# Patient Record
Sex: Female | Born: 1980 | Hispanic: Yes | Marital: Single | Smoking: Never smoker
Health system: Southern US, Community
[De-identification: ages and names within clinical notes are randomized; demographics above are authoritative.]

---

## 2019-06-07 ENCOUNTER — Emergency Department (HOSPITAL_BASED_OUTPATIENT_CLINIC_OR_DEPARTMENT_OTHER): Payer: Self-pay

## 2019-06-07 ENCOUNTER — Other Ambulatory Visit: Payer: Self-pay

## 2019-06-07 ENCOUNTER — Emergency Department (HOSPITAL_BASED_OUTPATIENT_CLINIC_OR_DEPARTMENT_OTHER)
Admission: EM | Admit: 2019-06-07 | Discharge: 2019-06-07 | Disposition: A | Discharge status: Home / Self Care | Payer: Self-pay | Attending: Emergency Medicine | Admitting: Emergency Medicine

## 2019-06-07 ENCOUNTER — Encounter (HOSPITAL_BASED_OUTPATIENT_CLINIC_OR_DEPARTMENT_OTHER): Payer: Self-pay | Admitting: Emergency Medicine

## 2019-06-07 DIAGNOSIS — R51 Headache: Secondary | ICD-10-CM | POA: Insufficient documentation

## 2019-06-07 DIAGNOSIS — R05 Cough: Secondary | ICD-10-CM | POA: Insufficient documentation

## 2019-06-07 DIAGNOSIS — R42 Dizziness and giddiness: Secondary | ICD-10-CM | POA: Insufficient documentation

## 2019-06-07 DIAGNOSIS — U071 COVID-19: Secondary | ICD-10-CM | POA: Insufficient documentation

## 2019-06-07 DIAGNOSIS — R079 Chest pain, unspecified: Secondary | ICD-10-CM | POA: Insufficient documentation

## 2019-06-07 DIAGNOSIS — R202 Paresthesia of skin: Secondary | ICD-10-CM | POA: Insufficient documentation

## 2019-06-07 LAB — URINALYSIS, MICROSCOPIC (REFLEX)

## 2019-06-07 LAB — COMPREHENSIVE METABOLIC PANEL
ALT: 29 U/L (ref 0–44)
AST: 27 U/L (ref 15–41)
Albumin: 3.8 g/dL (ref 3.5–5.0)
Alkaline Phosphatase: 68 U/L (ref 38–126)
Anion gap: 11 (ref 5–15)
BUN: 7 mg/dL (ref 6–20)
CO2: 21 mmol/L — ABNORMAL LOW (ref 22–32)
Calcium: 8.7 mg/dL — ABNORMAL LOW (ref 8.9–10.3)
Chloride: 106 mmol/L (ref 98–111)
Creatinine, Ser: 0.74 mg/dL (ref 0.44–1.00)
GFR calc Af Amer: >60 mL/min (ref >60)
GFR calc non Af Amer: >60 mL/min (ref >60)
Glucose, Bld: 108 mg/dL — ABNORMAL HIGH (ref 70–99)
Potassium: 3.6 mmol/L (ref 3.5–5.1)
Sodium: 138 mmol/L (ref 135–145)
Total Bilirubin: 0.3 mg/dL (ref 0.3–1.2)
Total Protein: 7.6 g/dL (ref 6.5–8.1)

## 2019-06-07 LAB — CBC
HCT: 40.2 % (ref 36.0–46.0)
Hemoglobin: 13.2 g/dL (ref 12.0–15.0)
MCH: 28.6 pg (ref 26.0–34.0)
MCHC: 32.8 g/dL (ref 30.0–36.0)
MCV: 87.2 fL (ref 80.0–100.0)
Platelets: 246 10*3/uL (ref 150–400)
RBC: 4.61 MIL/uL (ref 3.87–5.11)
RDW: 13 % (ref 11.5–15.5)
WBC: 3.4 10*3/uL — ABNORMAL LOW (ref 4.0–10.5)
nRBC: 0 % (ref 0.0–0.2)

## 2019-06-07 LAB — URINALYSIS, ROUTINE W REFLEX MICROSCOPIC
Bilirubin Urine: NEGATIVE
Glucose, UA: NEGATIVE mg/dL
Ketones, ur: NEGATIVE mg/dL
Nitrite: NEGATIVE
Protein, ur: NEGATIVE mg/dL
Specific Gravity, Urine: 1.01 (ref 1.005–1.030)
pH: 7.5 (ref 5.0–8.0)

## 2019-06-07 LAB — TROPONIN I (HIGH SENSITIVITY): Troponin I (High Sensitivity): 2 ng/L (ref <18)

## 2019-06-07 MED ORDER — SODIUM CHLORIDE 0.9 % IV BOLUS
1000.0000 mL | Freq: Once | INTRAVENOUS | Status: AC
  Administered 2019-06-07: 13:00:00 1000 mL via INTRAVENOUS

## 2019-06-07 NOTE — ED Notes (Signed)
RN Ezzard Flax was informed of Pt stating she tested positive for covid. Told RN Ezzard Flax that I got Pts vitals and had setup the monitor for an EKG, however, I stepped out of room due to not having right PPE because I was just informed of her positive test of covid.

## 2019-06-07 NOTE — ED Triage Notes (Signed)
Pt had chest pain this am. Pt is positive for Covid.  Pt states she has been sick since last Thursday.  Was starting to get better but has been worried about her 40 yo daughter who has recently been feverish.  Pt states she did not sleep well at all, maybe 1.5 hours and woke up suddenly in a panic.  Pt had chest pain, sob, facial and hand numbness.  All symptoms has subsided but wanted to be checked out.  Fevers at home have been normal.  No sob prior to this am.  Pt has generalized aches an pains otherwise.

## 2019-06-07 NOTE — ED Notes (Signed)
X-ray at bedside

## 2019-06-07 NOTE — Discharge Instructions (Addendum)
You were evaluated in the Emergency Department and after careful evaluation, we did not find any emergent condition requiring admission or further testing in the hospital.  Your symptoms today seem to be due to continued symptoms related to the coronavirus.  Your testing here is overall very reassuring.  You are safe to return home and drink plenty of fluids.  Please return to the Emergency Department if you experience any worsening of your condition.  We encourage you to follow up with a primary care provider.  Thank you for allowing [place] to be a part of your care.

## 2019-06-07 NOTE — ED Provider Notes (Signed)
[facility] Emergency Department Provider Note MRN:  [id number]  Arrival date & time: 06/07/19     Chief Complaint   Chest Pain   History of Present Illness   Kayla Serrano is a 38 y.o. year-old female with no pertinent past medical history presenting to the ED with chief complaint of chest pain.  Patient explains that she was recently diagnosed with the coronavirus.  She has been experiencing general malaise, mild cough, mild headache, decreased energy.  She did not sleep well last night because her daughter spiked a fever.  She became very anxious about her daughter's condition this morning, and shortly after began experiencing sharp chest pain accompanied by paresthesias to bilateral hands.  Symptoms resolved after a few minutes.  She denies shortness of breath, no leg pain or swelling, no numbness or weakness to the arms or legs.  Review of Systems  A complete 10 system review of systems was obtained and all systems are negative except as noted in the HPI and PMH.   Patient's Health History   Past medical history: Level coronavirus   No family history on file.  Social History   Socioeconomic History  . Marital status: Single    Spouse name: Not on file  . Number of children: Not on file  . Years of education: Not on file  . Highest education level: Not on file  Occupational History  . Not on file  Social Needs  . Financial resource strain: Not on file  . Food insecurity    Worry: Not on file    Inability: Not on file  . Transportation needs    Medical: Not on file    Non-medical: Not on file  Tobacco Use  . Smoking status: Never Smoker  . Smokeless tobacco: Never Used  Substance and Sexual Activity  . Alcohol use: Not on file  . Drug use: Not on file  . Sexual activity: Not on file  Lifestyle  . Physical activity    Days per week: Not on file    Minutes per session: Not on file  . Stress: Not on file  Relationships  .  Social Herbalist on phone: Not on file    Gets together: Not on file    Attends religious service: Not on file    Active member of club or organization: Not on file    Attends meetings of clubs or organizations: Not on file    Relationship status: Not on file  . Intimate partner violence    Fear of current or ex partner: Not on file    Emotionally abused: Not on file    Physically abused: Not on file    Forced sexual activity: Not on file  Other Topics Concern  . Not on file  Social History Narrative  . Not on file     Physical Exam  Vital Signs and Nursing Notes reviewed Vitals:   06/07/19 1222  BP: (!) 165/121  Pulse: 75  Resp: 16  Temp: 98.5 F (36.9 C)  SpO2: 100%    CONSTITUTIONAL: Well-appearing, NAD NEURO:  Alert and oriented x 3, normal and symmetric strength and sensation, normal coordination, normal speech EYES:  eyes equal and reactive ENT/NECK:  no LAD, no JVD CARDIO: Regular rate, well-perfused, normal S1 and S2 PULM:  CTAB no wheezing or rhonchi, no increased work of breathing GI/GU:  normal bowel sounds, non-distended, non-tender MSK/SPINE:  No gross deformities, no edema SKIN:  no rash, atraumatic PSYCH:  Appropriate speech and behavior  Diagnostic and Interventional Summary    EKG Interpretation  Date/Time:  Thursday June 07 2019 12:13:44 EDT Ventricular Rate:  72 PR Interval:    QRS Duration: 84 QT Interval:  405 QTC Calculation: 444 R Axis:   72 Text Interpretation:  Sinus rhythm Confirmed by Kennis CarinaBero, Johnmark Geiger [phone] on 06/07/2019 12:57:00 PM      Labs Reviewed  CBC - Abnormal; Notable for the following components:      Result Value   WBC 3.4 (*)    All other components within normal limits  COMPREHENSIVE METABOLIC PANEL - Abnormal; Notable for the following components:   CO2 21 (*)    Glucose, Bld 108 (*)    Calcium 8.7 (*)    All other components within normal limits  URINALYSIS, ROUTINE W REFLEX MICROSCOPIC - Abnormal;  Notable for the following components:   Hgb urine dipstick SMALL (*)    Leukocytes,Ua TRACE (*)    All other components within normal limits  URINALYSIS, MICROSCOPIC (REFLEX) - Abnormal; Notable for the following components:   Bacteria, UA MANY (*)    All other components within normal limits  TROPONIN I (HIGH SENSITIVITY)    DG Chest Port 1 View  Final Result      Medications  sodium chloride 0.9 % bolus 1,000 mL (1,000 mLs Intravenous New Bag/Given 06/07/19 1315)     Procedures Critical Care  ED Course and Medical Decision Making  I have reviewed the triage vital signs and the nursing notes.  Pertinent labs & imaging results that were available during my care of the patient were reviewed by me and considered in my medical decision making (see below for details).  Favoring dehydration as well as anxiety component causing chest discomfort and lightheadedness in this 38 year old female with a recent COVID-19 infection.  She has normal vital signs, she is with clear lungs, no increased work of breathing.  She has no evidence of DVT on exam, she is not tachycardic, she is PERC negative.  High-sensitivity troponin is less than 4, she has a low heart score, therefore there is a very low likelihood that this is cardiac etiology and she is appropriate for discharge.  After the discussed management above, the patient was determined to be safe for discharge.  The patient was in agreement with this plan and all questions regarding their care were answered.  ED return precautions were discussed and the patient will return to the ED with any significant worsening of condition.  Elmer SowMichael M. Pilar PlateBero, MD [facility] [email or web address]  Final Clinical Impressions(s) / ED Diagnoses     ICD-10-CM   1. Chest pain  R07.9 DG Chest [facility] Parort 1 View    DG Chest Port 1 View  2. Dizzy  R42   3. COVID-19 virus infection  U07.1     ED Discharge Orders    None          Sabas SousBero, Zayne Marovich M, MD 06/07/19 1420

## 2021-09-27 ENCOUNTER — Emergency Department (HOSPITAL_BASED_OUTPATIENT_CLINIC_OR_DEPARTMENT_OTHER): Payer: Self-pay

## 2021-09-27 ENCOUNTER — Other Ambulatory Visit: Payer: Self-pay

## 2021-09-27 ENCOUNTER — Encounter (HOSPITAL_BASED_OUTPATIENT_CLINIC_OR_DEPARTMENT_OTHER): Payer: Self-pay | Admitting: Emergency Medicine

## 2021-09-27 ENCOUNTER — Emergency Department (HOSPITAL_BASED_OUTPATIENT_CLINIC_OR_DEPARTMENT_OTHER)
Admission: EM | Admit: 2021-09-27 | Discharge: 2021-09-27 | Disposition: A | Discharge status: Home / Self Care | Payer: Self-pay | Attending: Emergency Medicine | Admitting: Emergency Medicine

## 2021-09-27 DIAGNOSIS — Z20822 Contact with and (suspected) exposure to covid-19: Secondary | ICD-10-CM | POA: Insufficient documentation

## 2021-09-27 DIAGNOSIS — N12 Tubulo-interstitial nephritis, not specified as acute or chronic: Secondary | ICD-10-CM | POA: Insufficient documentation

## 2021-09-27 DIAGNOSIS — J101 Influenza due to other identified influenza virus with other respiratory manifestations: Secondary | ICD-10-CM | POA: Insufficient documentation

## 2021-09-27 LAB — BASIC METABOLIC PANEL
Anion gap: 8 (ref 5–15)
BUN: 9 mg/dL (ref 6–20)
CO2: 25 mmol/L (ref 22–32)
Calcium: 8.6 mg/dL — ABNORMAL LOW (ref 8.9–10.3)
Chloride: 102 mmol/L (ref 98–111)
Creatinine, Ser: 0.99 mg/dL (ref 0.44–1.00)
GFR, Estimated: >60 mL/min (ref >60)
Glucose, Bld: 131 mg/dL — ABNORMAL HIGH (ref 70–99)
Potassium: 3.4 mmol/L — ABNORMAL LOW (ref 3.5–5.1)
Sodium: 135 mmol/L (ref 135–145)

## 2021-09-27 LAB — URINALYSIS, ROUTINE W REFLEX MICROSCOPIC
Bilirubin Urine: NEGATIVE
Glucose, UA: NEGATIVE mg/dL
Ketones, ur: NEGATIVE mg/dL
Nitrite: POSITIVE — AB
Protein, ur: 100 mg/dL — AB
Specific Gravity, Urine: 1.02 (ref 1.005–1.030)
pH: 6 (ref 5.0–8.0)

## 2021-09-27 LAB — CBC WITH DIFFERENTIAL/PLATELET
Abs Immature Granulocytes: 0.07 10*3/uL (ref 0.00–0.07)
Basophils Absolute: 0 10*3/uL (ref 0.0–0.1)
Basophils Relative: 0 %
Eosinophils Absolute: 0 10*3/uL (ref 0.0–0.5)
Eosinophils Relative: 0 %
HCT: 35.7 % — ABNORMAL LOW (ref 36.0–46.0)
Hemoglobin: 11.9 g/dL — ABNORMAL LOW (ref 12.0–15.0)
Immature Granulocytes: 1 %
Lymphocytes Relative: 8 %
Lymphs Abs: 0.8 10*3/uL (ref 0.7–4.0)
MCH: 28.3 pg (ref 26.0–34.0)
MCHC: 33.3 g/dL (ref 30.0–36.0)
MCV: 85 fL (ref 80.0–100.0)
Monocytes Absolute: 0.9 10*3/uL (ref 0.1–1.0)
Monocytes Relative: 8 %
Neutro Abs: 9 10*3/uL — ABNORMAL HIGH (ref 1.7–7.7)
Neutrophils Relative %: 83 %
Platelets: 197 10*3/uL (ref 150–400)
RBC: 4.2 MIL/uL (ref 3.87–5.11)
RDW: 14.5 % (ref 11.5–15.5)
WBC: 10.9 10*3/uL — ABNORMAL HIGH (ref 4.0–10.5)
nRBC: 0 % (ref 0.0–0.2)

## 2021-09-27 LAB — URINALYSIS, MICROSCOPIC (REFLEX)

## 2021-09-27 LAB — PREGNANCY, URINE: Preg Test, Ur: NEGATIVE

## 2021-09-27 LAB — RESP PANEL BY RT-PCR (FLU A&B, COVID) ARPGX2
Influenza A by PCR: POSITIVE — AB
Influenza B by PCR: NEGATIVE
SARS Coronavirus 2 by RT PCR: NEGATIVE

## 2021-09-27 MED ORDER — LACTATED RINGERS IV BOLUS
1000.0000 mL | Freq: Once | INTRAVENOUS | Status: AC
  Administered 2021-09-27: 1000 mL via INTRAVENOUS

## 2021-09-27 MED ORDER — ACETAMINOPHEN 325 MG PO TABS
650.0000 mg | ORAL_TABLET | Freq: Once | ORAL | Status: AC
  Administered 2021-09-27: 650 mg via ORAL
  Filled 2021-09-27: qty 2

## 2021-09-27 MED ORDER — PROMETHAZINE HCL 25 MG/ML IJ SOLN
INTRAMUSCULAR | Status: AC
  Filled 2021-09-27: qty 1

## 2021-09-27 MED ORDER — OSELTAMIVIR PHOSPHATE 75 MG PO CAPS: 75.0000 mg | ORAL_CAPSULE | Freq: Two times a day (BID) | ORAL | 0 refills | Status: AC

## 2021-09-27 MED ORDER — OSELTAMIVIR PHOSPHATE 75 MG PO CAPS
75.0000 mg | ORAL_CAPSULE | Freq: Once | ORAL | Status: AC
  Administered 2021-09-27: 75 mg via ORAL
  Filled 2021-09-27: qty 1

## 2021-09-27 MED ORDER — CIPROFLOXACIN HCL 500 MG PO TABS: 500.0000 mg | ORAL_TABLET | Freq: Two times a day (BID) | ORAL | 0 refills | Status: AC

## 2021-09-27 MED ORDER — PROMETHAZINE HCL 25 MG PO TABS: 25.0000 mg | ORAL_TABLET | Freq: Four times a day (QID) | ORAL | 0 refills | Status: AC | PRN

## 2021-09-27 MED ORDER — METOCLOPRAMIDE HCL 5 MG/ML IJ SOLN
10.0000 mg | Freq: Once | INTRAMUSCULAR | Status: AC
  Administered 2021-09-27: 10 mg via INTRAVENOUS
  Filled 2021-09-27: qty 2

## 2021-09-27 MED ORDER — FLUCONAZOLE 150 MG PO TABS: ORAL_TABLET | ORAL | 0 refills | Status: AC

## 2021-09-27 MED ORDER — SODIUM CHLORIDE 0.9 % IV SOLN
1.0000 g | Freq: Once | INTRAVENOUS | Status: AC
  Administered 2021-09-27: 1 g via INTRAVENOUS
  Filled 2021-09-27: qty 10

## 2021-09-27 MED ORDER — IOHEXOL 300 MG/ML  SOLN
100.0000 mL | Freq: Once | INTRAMUSCULAR | Status: AC | PRN
  Administered 2021-09-27: 100 mL via INTRAVENOUS

## 2021-09-27 MED ORDER — KETOROLAC TROMETHAMINE 15 MG/ML IJ SOLN
15.0000 mg | Freq: Once | INTRAMUSCULAR | Status: AC
  Administered 2021-09-27: 15 mg via INTRAVENOUS
  Filled 2021-09-27: qty 1

## 2021-09-27 MED ORDER — SODIUM CHLORIDE 0.9 % IV SOLN
12.5000 mg | Freq: Four times a day (QID) | INTRAVENOUS | Status: DC | PRN
  Administered 2021-09-27: 12.5 mg via INTRAVENOUS
  Filled 2021-09-27: qty 0.5

## 2021-09-27 NOTE — ED Provider Notes (Signed)
[facility] Provider Note: Kayla Dell, MD, FACEP  CSN: [id number] MRN: [id number] ARRIVAL: 09/27/21 at 0350 ROOM: MH02/MH02   CHIEF COMPLAINT  Fever   HISTORY OF PRESENT ILLNESS  09/27/21 4:03 AM Kayla Serrano is a 40 y.o. female with 3 days of headache, fever, sore throat and cough.  Yesterday she had some diarrhea and began vomiting.  She has had 2 days of right lower quadrant pain which is intermittent.  It radiates to the right flank.  She rates it as an 8 out of 10 and it is worse with palpation or movement.  She was recently exposed to influenza.    History reviewed. No pertinent past medical history.  History reviewed. No pertinent surgical history.  History reviewed. No pertinent family history.  Social History   Tobacco Use   Smoking status: Never   Smokeless tobacco: Never  Vaping Use   Vaping Use: Never used  Substance Use Topics   Alcohol use: Never   Drug use: Never    Prior to Admission medications   Not on File    Allergies Patient has no known allergies.   REVIEW OF SYSTEMS  Negative except as noted here or in the History of Present Illness.   PHYSICAL EXAMINATION  Initial Vital Signs Blood pressure (!) 160/90, pulse (!) 116, temperature 99.5 F (37.5 C), temperature source Oral, resp. rate 18, height 5\' 4"  (1.626 m), weight 80.7 kg, SpO2 100 %.  Examination General: Well-developed, well-nourished female in no acute distress; appearance consistent with age of record HENT: normocephalic; atraumatic; no pharyngeal erythema or exudate Eyes: pupils equal, round and reactive to light; extraocular muscles intact Neck: supple Heart: regular rate and rhythm; tachycardia Lungs: clear to auscultation bilaterally Abdomen: soft; nondistended; right lower quadrant tenderness; bowel sounds present GU: Mild right CVA tenderness Extremities: No deformity; full range of motion; pulses normal Neurologic: Awake, alert and  oriented; motor function intact in all extremities and symmetric; no facial droop Skin: Warm and dry Psychiatric: Normal mood and affect   RESULTS  Summary of this visit's results, reviewed and interpreted by myself:   EKG Interpretation  Date/Time:    Ventricular Rate:    PR Interval:    QRS Duration:   QT Interval:    QTC Calculation:   R Axis:     Text Interpretation:         Laboratory Studies: Results for orders placed or performed during the hospital encounter of 09/27/21 (from the past 24 hour(s))  Resp Panel by RT-PCR (Flu A&B, Covid) Nasopharyngeal Swab     Status: Abnormal   Collection Time: 09/27/21  4:15 AM   Specimen: Nasopharyngeal Swab; Nasopharyngeal(NP) swabs in vial transport medium  Result Value Ref Range   SARS Coronavirus 2 by RT PCR NEGATIVE NEGATIVE   Influenza A by PCR POSITIVE (A) NEGATIVE   Influenza B by PCR NEGATIVE NEGATIVE  Urinalysis, Routine w reflex microscopic Urine, Clean Catch     Status: Abnormal   Collection Time: 09/27/21  4:15 AM  Result Value Ref Range   Color, Urine YELLOW YELLOW   APPearance CLOUDY (A) CLEAR   Specific Gravity, Urine 1.020 1.005 - 1.030   pH 6.0 5.0 - 8.0   Glucose, UA NEGATIVE NEGATIVE mg/dL   Hgb urine dipstick MODERATE (A) NEGATIVE   Bilirubin Urine NEGATIVE NEGATIVE   Ketones, ur NEGATIVE NEGATIVE mg/dL   Protein, ur 09/29/21 (A) NEGATIVE mg/dL   Nitrite POSITIVE (A) NEGATIVE   Leukocytes,Ua SMALL (A)  NEGATIVE  Pregnancy, urine     Status: None   Collection Time: 09/27/21  4:15 AM  Result Value Ref Range   Preg Test, Ur NEGATIVE NEGATIVE  CBC with Differential/Platelet     Status: Abnormal   Collection Time: 09/27/21  4:15 AM  Result Value Ref Range   WBC 10.9 (H) 4.0 - 10.5 K/uL   RBC 4.20 3.87 - 5.11 MIL/uL   Hemoglobin 11.9 (L) 12.0 - 15.0 g/dL   HCT 68.0 (L) 32.1 - 22.4 %   MCV 85.0 80.0 - 100.0 fL   MCH 28.3 26.0 - 34.0 pg   MCHC 33.3 30.0 - 36.0 g/dL   RDW 82.5 00.3 - 70.4 %   Platelets 197  150 - 400 K/uL   nRBC 0.0 0.0 - 0.2 %   Neutrophils Relative % 83 %   Neutro Abs 9.0 (H) 1.7 - 7.7 K/uL   Lymphocytes Relative 8 %   Lymphs Abs 0.8 0.7 - 4.0 K/uL   Monocytes Relative 8 %   Monocytes Absolute 0.9 0.1 - 1.0 K/uL   Eosinophils Relative 0 %   Eosinophils Absolute 0.0 0.0 - 0.5 K/uL   Basophils Relative 0 %   Basophils Absolute 0.0 0.0 - 0.1 K/uL   Immature Granulocytes 1 %   Abs Immature Granulocytes 0.07 0.00 - 0.07 K/uL  Basic metabolic panel     Status: Abnormal   Collection Time: 09/27/21  4:15 AM  Result Value Ref Range   Sodium 135 135 - 145 mmol/L   Potassium 3.4 (L) 3.5 - 5.1 mmol/L   Chloride 102 98 - 111 mmol/L   CO2 25 22 - 32 mmol/L   Glucose, Bld 131 (H) 70 - 99 mg/dL   BUN 9 6 - 20 mg/dL   Creatinine, Ser 8.88 0.44 - 1.00 mg/dL   Calcium 8.6 (L) 8.9 - 10.3 mg/dL   GFR, Estimated >91 >69 mL/min   Anion gap 8 5 - 15  Urinalysis, Microscopic (reflex)     Status: Abnormal   Collection Time: 09/27/21  4:15 AM  Result Value Ref Range   RBC / HPF 11-20 0 - 5 RBC/hpf   WBC, UA 21-50 0 - 5 WBC/hpf   Bacteria, UA MANY (A) NONE SEEN   Squamous Epithelial / LPF 6-10 0 - 5   Non Squamous Epithelial PRESENT (A) NONE SEEN   WBC Clumps PRESENT    Mucus PRESENT    Hyaline Casts, UA PRESENT    Imaging Studies: CT ABDOMEN PELVIS W CONTRAST  Result Date: 09/27/2021 CLINICAL DATA:  40 year old female with history of right lower quadrant abdominal pain. Headache, cough, fever and vomiting for the past 2 days. EXAM: CT ABDOMEN AND PELVIS WITH CONTRAST TECHNIQUE: Multidetector CT imaging of the abdomen and pelvis was performed using the standard protocol following bolus administration of intravenous contrast. CONTRAST:  OMNIPAQUE IOHEXOL 300 MG/ML  SOLN COMPARISON:  No priors. FINDINGS: Lower chest: Unremarkable. Hepatobiliary: Subcentimeter low-attenuation lesion in the right lobe of the liver, too small to characterize, but statistically likely to represent tiny  cysts. No other suspicious appearing hepatic lesions. No intra or extrahepatic biliary ductal dilatation. Gallbladder is normal in appearance. Pancreas: No pancreatic mass. No pancreatic ductal dilatation. No pancreatic or peripancreatic fluid collections or inflammatory changes. Spleen: Tiny calcified granulomas in the spleen. Otherwise, unremarkable. Adrenals/Urinary Tract: Left kidney and bilateral adrenal glands are normal in appearance. Right kidney appears enlarged with striated nephrogram, concerning for potential pyelonephritis. Mild fullness of the right  renal collecting system with enhancement of the urothelium in the right renal pelvis and ureter. No left hydroureteronephrosis. Urinary bladder is normal in appearance. Stomach/Bowel: The appearance of the stomach is normal. No pathologic dilatation of small bowel or colon. Normal appendix. Vascular/Lymphatic: Mild aortic atherosclerosis, without evidence of aneurysm or dissection in the abdominal or pelvic vasculature. No lymphadenopathy noted in the abdomen or pelvis. Several prominent but nonenlarged retroperitoneal lymph nodes are incidentally noted, presumably reactive. Reproductive: Uterus and ovaries are unremarkable in appearance. Other: Extensive right-sided perinephric and periureteric soft tissue stranding. Trace volume of free fluid in the cul-de-sac, presumably physiologic in this young female patient. No larger volume of ascites. No pneumoperitoneum. Musculoskeletal: There are no aggressive appearing lytic or blastic lesions noted in the visualized portions of the skeleton. IMPRESSION: 1. Findings, as above, concerning for right-sided pyelonephritis. Correlation with urinalysis is recommended. 2. No other acute findings to account for the patient's symptoms. Electronically Signed   By: Trudie Reed M.D.   On: 09/27/2021 05:50    ED COURSE and MDM  Nursing notes, initial and subsequent vitals signs, including pulse oximetry, reviewed and  interpreted by myself.  Vitals:   09/27/21 0430 09/27/21 0445 09/27/21 0500 09/27/21 0517  BP: (!) 153/96 (!) 148/91 (!) 152/94 (!) 152/94  Pulse: (!) 114 (!) 110 (!) 109 (!) 109  Resp:    18  Temp:    99.5 F (37.5 C)  TempSrc:    Oral  SpO2: 100% 99% 98% 98%  Weight:      Height:       Medications  cefTRIAXone (ROCEPHIN) 1 g in sodium chloride 0.9 % 100 mL IVPB (1 g Intravenous New Bag/Given 09/27/21 0547)  metoCLOPramide (REGLAN) injection 10 mg (10 mg Intravenous Given 09/27/21 0421)  ketorolac (TORADOL) 15 MG/ML injection 15 mg (15 mg Intravenous Given 09/27/21 0422)  lactated ringers bolus 1,000 mL (0 mLs Intravenous Stopped 09/27/21 0547)  iohexol (OMNIPAQUE) 300 MG/ML solution 100 mL (100 mLs Intravenous Contrast Given 09/27/21 0522)  oseltamivir (TAMIFLU) capsule 75 mg (75 mg Oral Given 09/27/21 0543)   5:39 AM Patient's right CVA tenderness with perinephric stranding cyst of the right kidney seen on CT scan are consistent with pyelonephritis.  Urinalysis is consistent with a urinary tract infection and has been sent for culture.  Rocephin started IV.  Tamiflu started for influenza A.  We will treat with ciprofloxacin as patient does not have insurance and cefpodoxime is likely prohibitively expensive.  We will treat nausea with Phenergan, avoiding ondansetron due to the risk of QT prolongation with administered with Cipro.   PROCEDURES  Procedures   ED DIAGNOSES     ICD-10-CM   1. Influenza A  J10.1     2. Pyelonephritis  N12          Dreyah Montrose, MD 09/27/21 [phone]

## 2021-09-27 NOTE — ED Notes (Signed)
Went to discharge patient, pt vomiting, MD notified.

## 2021-09-27 NOTE — ED Notes (Signed)
Family at bedside. 

## 2021-09-27 NOTE — ED Triage Notes (Signed)
Pt has had headache, cough, fever, right sided abdominal pain and vomiting x 2 days.  Pt's daughter had flu last week.

## 2021-09-29 LAB — URINE CULTURE: Culture: >=100000 — AB

## 2021-09-30 ENCOUNTER — Telehealth: Payer: Self-pay | Admitting: *Deleted

## 2021-09-30 NOTE — Telephone Encounter (Signed)
Post ED Visit - Positive Culture Follow-up  Culture report reviewed by antimicrobial stewardship pharmacist: [facility] []  , Pharm.D. []  Enzo Bi, Pharm.D., BCPS AQ-ID []  , Pharm.D., BCPS []  Celedonio Miyamoto, Pharm.D., BCPS []  [place], Garvin Fila.D., BCPS, AAHIVP []  , Pharm.D., BCPS, AAHIVP []  Georgina Pillion, PharmD, BCPS []  , PharmD, BCPS []  [place], PharmD, BCPS []  [address], PharmD []  , PharmD, BCPS []  Estella Husk, PharmD  Pharmacy Team []  Lysle Pearl, PharmD []  , PharmD []  Phillips Climes, PharmD []  , Rph []  Agapito Games) , PharmD []  Verlan Friends, PharmD []  , PharmD []  Mervyn Gay, PharmD []  , PharmD []  Vinnie Level, PharmD []  [facility], PharmD []  , PharmD []  Len Childs, PharmD   Positive urine culture Treated with Coprofloxacin, HCL, organism sensitive to the same and no further patient follow-up is required at this time.  , PharmD  Greer Pickerel Talley 09/30/2021, 10:02 AM

## 2023-04-27 IMAGING — CT CT ABD-PELV W/ CM
2 of 5 series · 15 of 46 positions shown, 17 images · IV contrast (Omnipaque)
Comparison: No priors.

CLINICAL DATA: 40-year-old female with history of right lower
quadrant abdominal pain. Headache, cough, fever and vomiting for the
past 2 days.

EXAM:
CT ABDOMEN AND PELVIS WITH CONTRAST
TECHNIQUE: Multidetector CT imaging of the abdomen and pelvis was performed
using the standard protocol following bolus administration of
intravenous contrast.
CONTRAST:  100mL OMNIPAQUE IOHEXOL 300 MG/ML  SOLN

[Series 2: axial st · axial · 0.98mm/px · z∈[+664,+1124]mm · 12 of 104 slices shown, 14 images]
[im 6/104  soft-tissue]
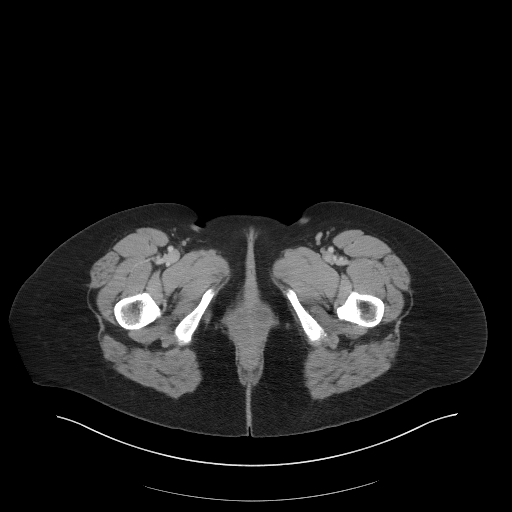
[im 6/104  bone]
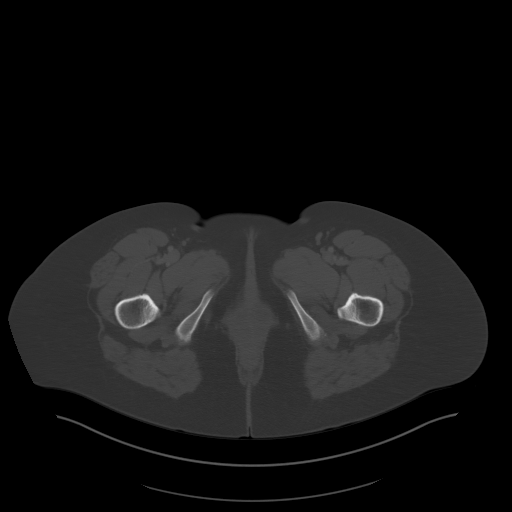
[im 17/104  soft-tissue]
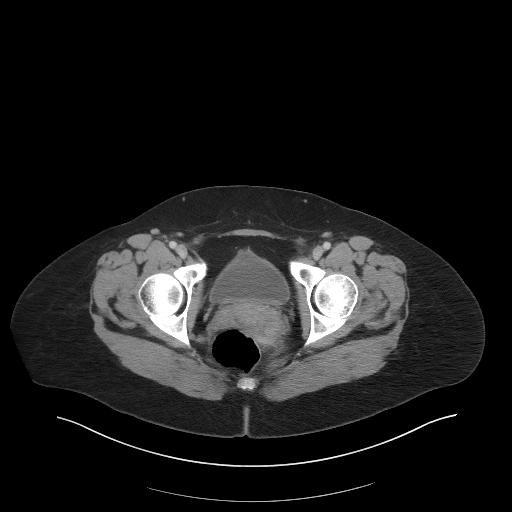
[im 22/104  soft-tissue]
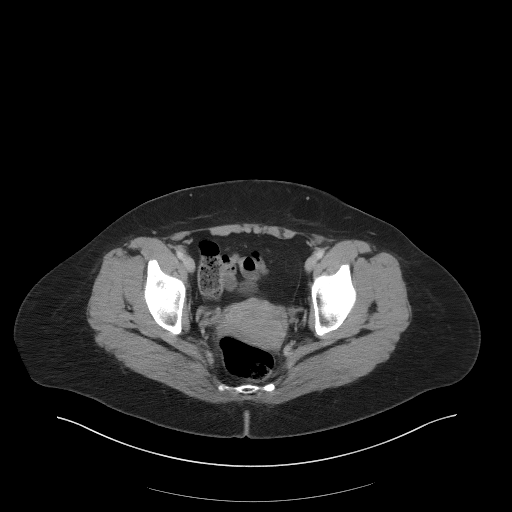
[im 33/104  soft-tissue]
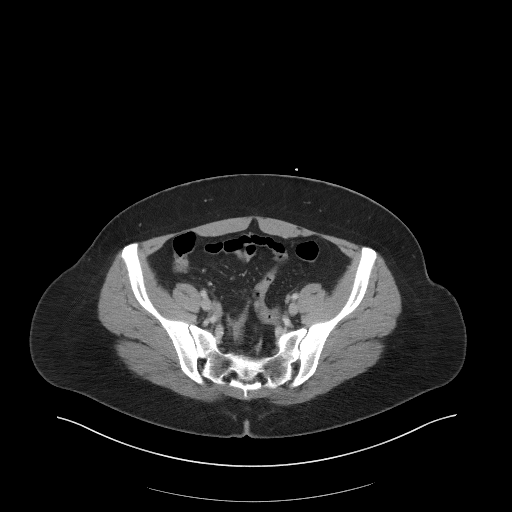
[im 38/104  soft-tissue]
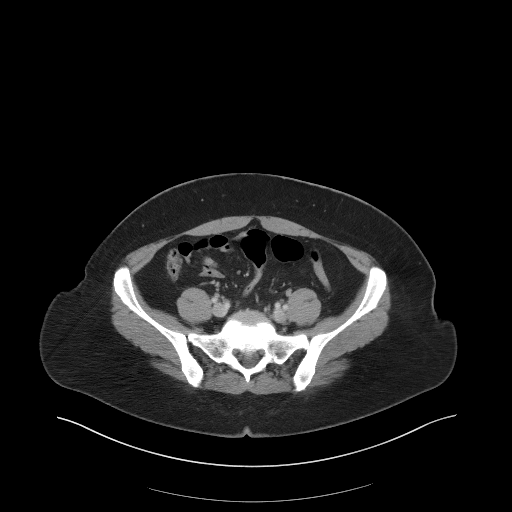
[im 49/104  soft-tissue]
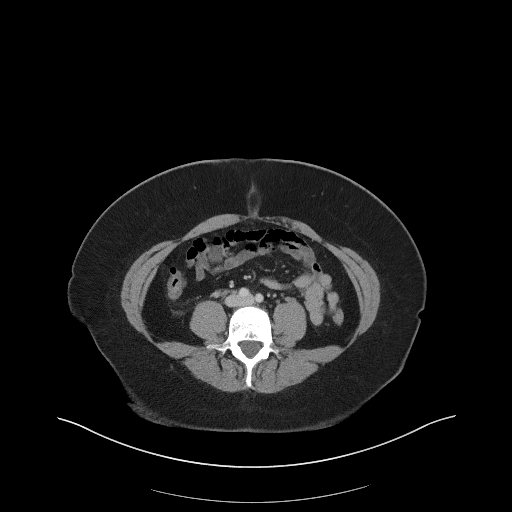
[im 55/104  soft-tissue]
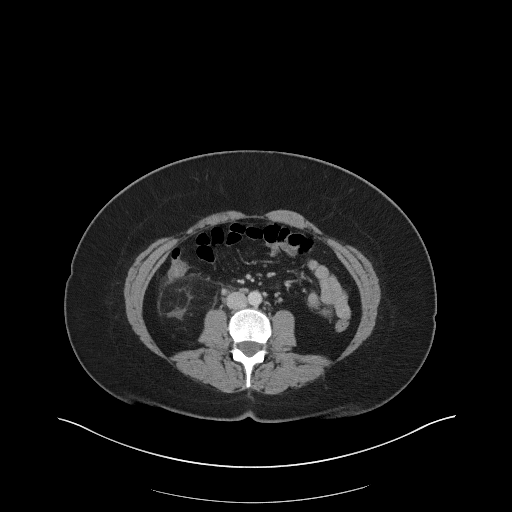
[im 66/104  soft-tissue]
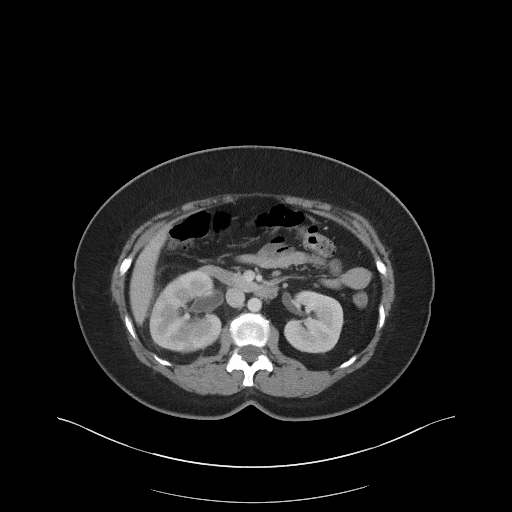
[im 71/104  soft-tissue]
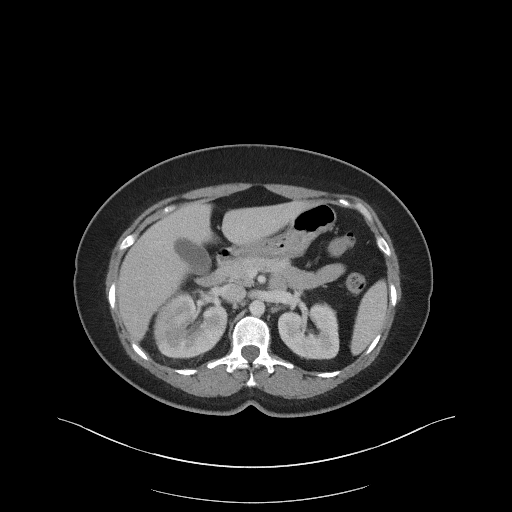
[im 71/104  bone]
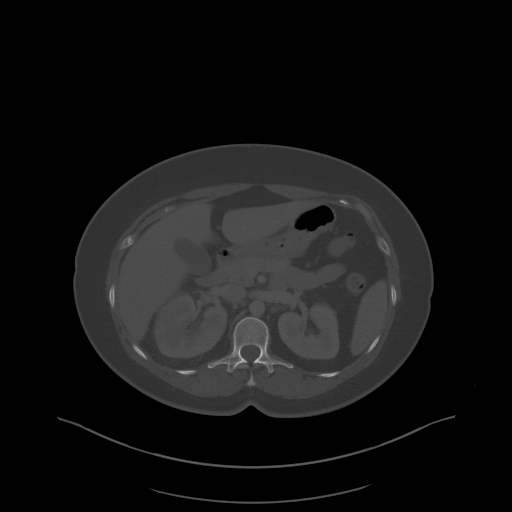
[im 82/104  soft-tissue]
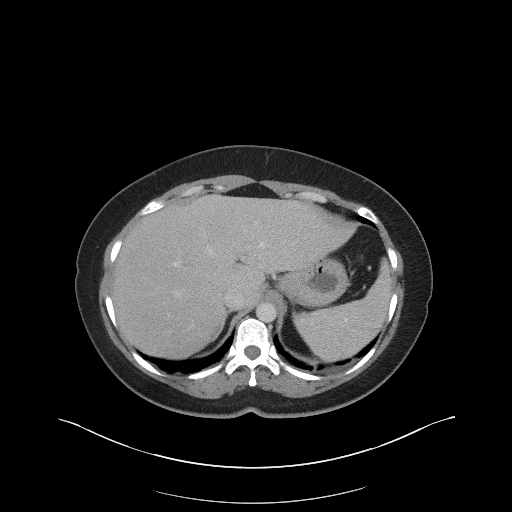
[im 87/104  soft-tissue]
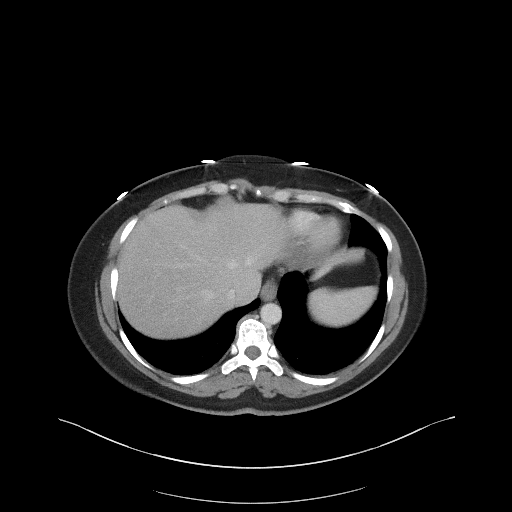
[im 98/104  soft-tissue]
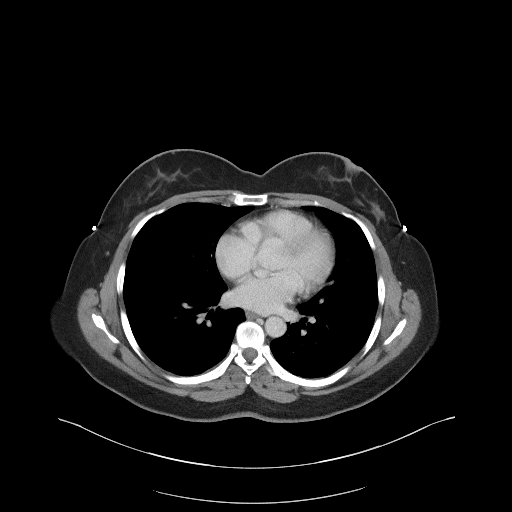

[Series 5: coronal st · coronal · 0.77mm/px · 3 of 101 slices shown]
[im 34/101  soft-tissue]
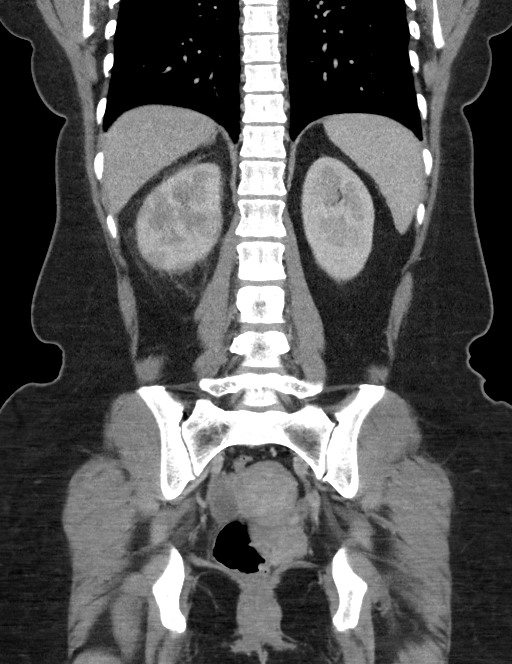
[im 45/101  soft-tissue]
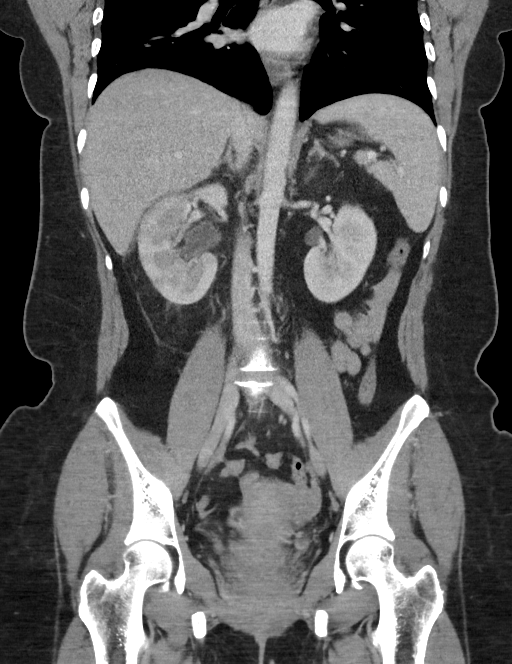
[im 56/101  soft-tissue]
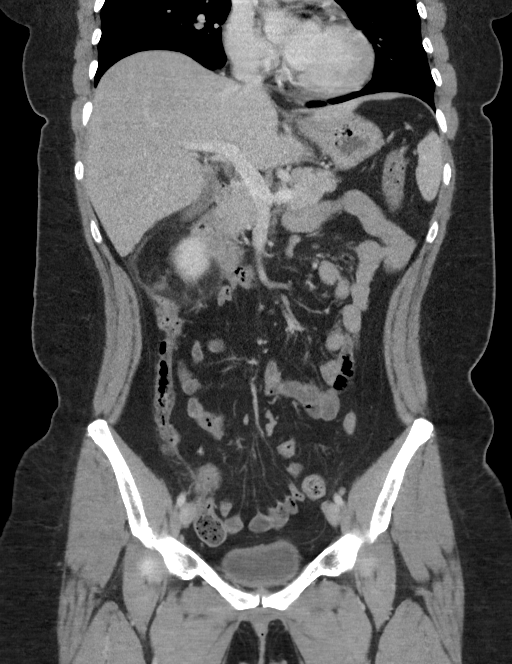

[15 of 46 positions shown; findings below may reference images not displayed]

FINDINGS: Lower chest: Unremarkable.

Hepatobiliary: Subcentimeter low-attenuation lesion in the right
lobe of the liver, too small to characterize, but statistically
likely to represent tiny cysts. No other suspicious appearing
hepatic lesions. No intra or extrahepatic biliary ductal dilatation.
Gallbladder is normal in appearance.

Pancreas: No pancreatic mass. No pancreatic ductal dilatation. No
pancreatic or peripancreatic fluid collections or inflammatory
changes.

Spleen: Tiny calcified granulomas in the spleen. Otherwise,
unremarkable.

Adrenals/Urinary Tract: Left kidney and bilateral adrenal glands are
normal in appearance. Right kidney appears enlarged with striated
nephrogram, concerning for potential pyelonephritis. Mild fullness
of the right renal collecting system with enhancement of the
urothelium in the right renal pelvis and ureter. No left
hydroureteronephrosis. Urinary bladder is normal in appearance.

Stomach/Bowel: The appearance of the stomach is normal. No
pathologic dilatation of small bowel or colon. Normal appendix.

Vascular/Lymphatic: Mild aortic atherosclerosis, without evidence of
aneurysm or dissection in the abdominal or pelvic vasculature. No
lymphadenopathy noted in the abdomen or pelvis. Several prominent
but nonenlarged retroperitoneal lymph nodes are incidentally noted,
presumably reactive.

Reproductive: Uterus and ovaries are unremarkable in appearance.

Other: Extensive right-sided perinephric and periureteric soft
tissue stranding. Trace volume of free fluid in the cul-de-sac,
presumably physiologic in this young female patient. No larger
volume of ascites. No pneumoperitoneum.

Musculoskeletal: There are no aggressive appearing lytic or blastic
lesions noted in the visualized portions of the skeleton.
IMPRESSION: 1. Findings, as above, concerning for right-sided pyelonephritis.
Correlation with urinalysis is recommended.
2. No other acute findings to account for the patient's symptoms.

## 2023-07-29 ENCOUNTER — Telehealth: Payer: Self-pay

## 2023-07-29 NOTE — Telephone Encounter (Signed)
Telephoned patient using interpreter#[phone]. Left voice message with [facility] (scholarship) contact information.

## 2024-06-28 ENCOUNTER — Other Ambulatory Visit: Payer: Self-pay | Admitting: Family Medicine

## 2024-06-28 DIAGNOSIS — Z1231 Encounter for screening mammogram for malignant neoplasm of breast: Secondary | ICD-10-CM

## 2024-07-04 ENCOUNTER — Ambulatory Visit
Admission: RE | Admit: 2024-07-04 | Discharge: 2024-07-04 | Disposition: A | Discharge status: Home / Self Care | Payer: Self-pay | Source: Ambulatory Visit | Attending: Family Medicine | Admitting: Family Medicine

## 2024-07-04 DIAGNOSIS — Z1231 Encounter for screening mammogram for malignant neoplasm of breast: Secondary | ICD-10-CM
